# Patient Record
Sex: Female | Born: 1960 | Race: Black or African American | Hispanic: No | Marital: Single | Smoking: Never smoker
Health system: Southern US, Community
[De-identification: ages and names within clinical notes are randomized; demographics above are authoritative.]

## PROBLEM LIST (undated history)

## (undated) DIAGNOSIS — G43909 Migraine, unspecified, not intractable, without status migrainosus: Secondary | ICD-10-CM

## (undated) HISTORY — PX: ABDOMINAL HYSTERECTOMY: SHX81

## (undated) HISTORY — PX: BREAST BIOPSY: SHX20

---

## 2004-09-01 ENCOUNTER — Emergency Department: Payer: Self-pay | Admitting: Emergency Medicine

## 2005-02-01 ENCOUNTER — Ambulatory Visit: Payer: Self-pay | Admitting: Internal Medicine

## 2006-03-13 ENCOUNTER — Inpatient Hospital Stay: Payer: Self-pay | Admitting: Obstetrics and Gynecology

## 2006-05-10 ENCOUNTER — Ambulatory Visit: Payer: Self-pay | Admitting: Obstetrics and Gynecology

## 2006-06-05 ENCOUNTER — Ambulatory Visit: Payer: Self-pay | Admitting: Obstetrics and Gynecology

## 2007-02-21 ENCOUNTER — Ambulatory Visit: Payer: Self-pay

## 2007-03-23 ENCOUNTER — Ambulatory Visit: Payer: Self-pay

## 2007-04-20 ENCOUNTER — Ambulatory Visit: Payer: Self-pay | Admitting: Dermatology

## 2007-06-08 ENCOUNTER — Emergency Department: Payer: Self-pay | Admitting: Emergency Medicine

## 2007-06-15 ENCOUNTER — Ambulatory Visit: Payer: Self-pay | Admitting: Dermatology

## 2007-07-17 ENCOUNTER — Ambulatory Visit: Payer: Self-pay | Admitting: Dermatology

## 2008-07-17 ENCOUNTER — Ambulatory Visit: Payer: Self-pay | Admitting: Internal Medicine

## 2009-04-08 ENCOUNTER — Ambulatory Visit: Payer: Self-pay | Admitting: Internal Medicine

## 2010-05-12 ENCOUNTER — Ambulatory Visit: Payer: Self-pay | Admitting: Internal Medicine

## 2011-09-22 ENCOUNTER — Ambulatory Visit: Payer: Self-pay

## 2012-09-24 ENCOUNTER — Ambulatory Visit: Payer: Self-pay | Admitting: Gastroenterology

## 2012-09-25 ENCOUNTER — Ambulatory Visit: Payer: Self-pay | Admitting: Internal Medicine

## 2012-11-05 ENCOUNTER — Ambulatory Visit: Payer: Self-pay | Admitting: Internal Medicine

## 2014-01-17 ENCOUNTER — Ambulatory Visit: Payer: Self-pay | Admitting: Internal Medicine

## 2015-01-23 ENCOUNTER — Ambulatory Visit
Admission: EM | Admit: 2015-01-23 | Discharge: 2015-01-23 | Disposition: A | Discharge status: Home / Self Care | Payer: No Typology Code available for payment source | Attending: Family Medicine | Admitting: Family Medicine

## 2015-01-23 DIAGNOSIS — J029 Acute pharyngitis, unspecified: Secondary | ICD-10-CM

## 2015-01-23 DIAGNOSIS — J019 Acute sinusitis, unspecified: Secondary | ICD-10-CM

## 2015-01-23 DIAGNOSIS — J40 Bronchitis, not specified as acute or chronic: Secondary | ICD-10-CM | POA: Diagnosis not present

## 2015-01-23 DIAGNOSIS — H6593 Unspecified nonsuppurative otitis media, bilateral: Secondary | ICD-10-CM | POA: Diagnosis not present

## 2015-01-23 HISTORY — DX: Migraine, unspecified, not intractable, without status migrainosus: G43.909

## 2015-01-23 LAB — RAPID STREP SCREEN (MED CTR MEBANE ONLY): STREPTOCOCCUS, GROUP A SCREEN (DIRECT): NEGATIVE

## 2015-01-23 MED ORDER — BENZONATATE 200 MG PO CAPS: 200.0000 mg | ORAL_CAPSULE | Freq: Three times a day (TID) | ORAL | Status: DC | PRN

## 2015-01-23 MED ORDER — SALINE SPRAY 0.65 % NA SOLN: 2.0000 | NASAL | Status: DC

## 2015-01-23 MED ORDER — FLUTICASONE PROPIONATE 50 MCG/ACT NA SUSP: 1.0000 | Freq: Two times a day (BID) | NASAL | Status: DC

## 2015-01-23 MED ORDER — AMOXICILLIN-POT CLAVULANATE 875-125 MG PO TABS: 1.0000 | ORAL_TABLET | Freq: Two times a day (BID) | ORAL | Status: DC

## 2015-01-23 MED ORDER — ACETAMINOPHEN 500 MG PO TABS
1000.0000 mg | ORAL_TABLET | Freq: Four times a day (QID) | ORAL | Status: DC | PRN
Start: 2015-01-23 — End: 2017-03-17

## 2015-01-23 NOTE — ED Provider Notes (Addendum)
CSN: [id number]     Arrival date & time 01/23/15  0848 History   First MD Initiated Contact with Patient 01/23/15 0912     Chief Complaint  Patient presents with  . URI   (Consider location/radiation/quality/duration/timing/severity/associated sxs/prior Treatment) HPI Comments: Married african [place] female works at [employer] in [employer].  Denied exposure to sick contacts.  Started 19 Jan 2015 hoarse voice which is now coming back had lost it, productive green cough, nasal congestion, taking mucinex has helped with congestion.  Frontal headache.  Denied seasonal allergies.  Taking steam showers.  Off work until 24 Oct.  Patient is a 54 y.o. female presenting with URI. The history is provided by the patient.  URI Presenting symptoms: congestion, cough, facial pain and sore throat   Presenting symptoms: no ear pain, no fatigue, no fever and no rhinorrhea   Congestion:    Location:  Nasal   Interferes with sleep: no     Interferes with eating/drinking: no   Cough:    Cough characteristics:  Productive   Sputum characteristics:  Green   Severity:  Mild   Onset quality:  Sudden   Duration:  5 days   Timing:  Intermittent   Progression:  Unchanged   Chronicity:  New Sore throat:    Severity:  Moderate   Onset quality:  Sudden   Duration:  5 days   Timing:  Constant   Progression:  Unchanged Severity:  Moderate Onset quality:  Sudden Duration:  5 days Timing:  Constant Progression:  Unchanged Chronicity:  New Relieved by:  Nothing Ineffective treatments:  Decongestant, OTC medications, rest and drinking Associated symptoms: headaches   Associated symptoms: no arthralgias, no myalgias, no neck pain, no sinus pain, no sneezing, no swollen glands and no wheezing   Headaches:    Severity:  Moderate   Onset quality:  Sudden   Duration:  5 days   Timing:  Intermittent   Progression:  Unchanged   Chronicity:  New Risk factors: not elderly, no chronic cardiac disease, no  chronic kidney disease, no chronic respiratory disease, no diabetes mellitus, no immunosuppression, no recent illness, no recent travel and no sick contacts     Past Medical History  Diagnosis Date  . Migraines    Past Surgical History  Procedure Laterality Date  . Abdominal hysterectomy     Family History  Problem Relation Age of Onset  . Diabetes Mother   . Hypertension Mother   . Heart failure Father    Social History  Substance Use Topics  . Smoking status: Never Smoker   . Smokeless tobacco: None  . Alcohol Use: No   OB History    No data available     Review of Systems  Constitutional: Negative for fever, chills, diaphoresis, activity change, appetite change, fatigue and unexpected weight change.  HENT: Positive for congestion, sore throat and voice change. Negative for dental problem, drooling, ear discharge, ear pain, facial swelling, hearing loss, mouth sores, nosebleeds, postnasal drip, rhinorrhea, sinus pressure, sneezing, tinnitus and trouble swallowing.   Eyes: Negative for photophobia, pain, discharge, redness, itching and visual disturbance.  Respiratory: Positive for cough. Negative for choking, chest tightness, shortness of breath, wheezing and stridor.   Cardiovascular: Negative for chest pain, palpitations and leg swelling.  Gastrointestinal: Negative for nausea, vomiting, abdominal pain, diarrhea, constipation, blood in stool and abdominal distention.  Endocrine: Negative for cold intolerance and heat intolerance.  Genitourinary: Negative for dysuria.  Musculoskeletal: Negative for myalgias, back pain,  joint swelling, arthralgias, gait problem, neck pain and neck stiffness.  Skin: Negative for color change, pallor, rash and wound.  Allergic/Immunologic: Negative for environmental allergies and food allergies.  Neurological: Positive for headaches. Negative for dizziness, tremors, seizures, syncope, facial asymmetry, speech difficulty, weakness,  light-headedness and numbness.  Hematological: Negative for adenopathy. Does not bruise/bleed easily.  Psychiatric/Behavioral: Negative for behavioral problems, confusion, sleep disturbance and agitation.    Allergies  Review of patient's allergies indicates no known allergies.  Home Medications   Prior to Admission medications   Medication Sig Start Date End Date Taking? Authorizing Provider  amitriptyline (ELAVIL) 25 MG tablet Take 25 mg by mouth at bedtime.   Yes Historical Provider, MD  acetaminophen (TYLENOL) 500 MG tablet Take 2 tablets (1,000 mg total) by mouth every 6 (six) hours as needed for moderate pain or fever. 01/23/15   Barbaraann Barthel, NP  amoxicillin-clavulanate (AUGMENTIN) 875-125 MG tablet Take 1 tablet by mouth every 12 (twelve) hours. 01/23/15   Barbaraann Barthel, NP  benzonatate (TESSALON) 200 MG capsule Take 1 capsule (200 mg total) by mouth 3 (three) times daily as needed for cough. 01/23/15   Barbaraann Barthel, NP  fluticasone (FLONASE) 50 MCG/ACT nasal spray Place 1 spray into both nostrils 2 (two) times daily. 01/23/15   Barbaraann Barthel, NP  sodium chloride (OCEAN) 0.65 % SOLN nasal spray Place 2 sprays into both nostrils every 2 (two) hours while awake. 01/23/15   Barbaraann Barthel, NP   Meds Ordered and Administered this Visit  Medications - No data to display  BP 86/59 mmHg  Pulse 82  Temp(Src) 96.7 F (35.9 C) (Tympanic)  Resp 17  Ht 5\' 1"  (1.549 m)  Wt 145 lb (65.772 kg)  BMI 27.41 kg/m2  SpO2 100% No data found.   Physical Exam  Constitutional: She is oriented to person, place, and time. Vital signs are normal. She appears well-developed and well-nourished. She is active.  Non-toxic appearance. She does not have a sickly appearance. She appears ill. No distress.  HENT:  Head: Normocephalic and atraumatic.  Right Ear: Hearing, external ear and ear canal normal. A middle ear effusion is present.  Left Ear: Hearing, external ear and ear canal  normal. A middle ear effusion is present.  Nose: Mucosal edema and rhinorrhea present. No nose lacerations, sinus tenderness, nasal deformity, septal deviation or nasal septal hematoma. No epistaxis.  No foreign bodies. Right sinus exhibits no maxillary sinus tenderness and no frontal sinus tenderness. Left sinus exhibits no maxillary sinus tenderness and no frontal sinus tenderness.  Mouth/Throat: Uvula is midline and oropharynx is clear and moist. Mucous membranes are not pale, not dry and not cyanotic. She does not have dentures. No oral lesions. No trismus in the jaw. Normal dentition. No dental abscesses, uvula swelling, lacerations or dental caries. No oropharyngeal exudate.  Macular oropharyngeal rash; malampati IV airway; bilateral TMs with air fluid level clear; bilateral nasal turbinates with edema/erythema  Eyes: Conjunctivae, EOM and lids are normal. Pupils are equal, round, and reactive to light. Right eye exhibits no chemosis, no discharge, no exudate and no hordeolum. No foreign body present in the right eye. Left eye exhibits no chemosis, no discharge, no exudate and no hordeolum. No foreign body present in the left eye. Right conjunctiva is not injected. Right conjunctiva has no hemorrhage. Left conjunctiva is not injected. Left conjunctiva has no hemorrhage. No scleral icterus. Right eye exhibits normal extraocular motion and no nystagmus. Left eye exhibits normal  extraocular motion and no nystagmus. Right pupil is round and reactive. Left pupil is round and reactive. Pupils are equal.  Neck: Trachea normal and normal range of motion. Neck supple. No tracheal tenderness, no spinous process tenderness and no muscular tenderness present. No rigidity. No tracheal deviation, no edema, no erythema and normal range of motion present. No thyroid mass and no thyromegaly present.  Cardiovascular: Normal rate, regular rhythm, S1 normal, S2 normal, normal heart sounds and intact distal pulses.  PMI is  not displaced.  Exam reveals no gallop and no friction rub.   No murmur heard. Pulmonary/Chest: Effort normal and breath sounds normal. No accessory muscle usage or stridor. No respiratory distress. She has no decreased breath sounds. She has no wheezes. She has no rhonchi. She has no rales. She exhibits no tenderness.  Abdominal: Soft. She exhibits no distension.  Musculoskeletal: Normal range of motion. She exhibits no edema or tenderness.  Lymphadenopathy:       Head (right side): No submental, no submandibular, no tonsillar, no preauricular, no posterior auricular and no occipital adenopathy present.       Head (left side): No submental, no submandibular, no tonsillar, no preauricular, no posterior auricular and no occipital adenopathy present.    She has no cervical adenopathy.       Right cervical: No superficial cervical, no deep cervical and no posterior cervical adenopathy present.      Left cervical: No superficial cervical, no deep cervical and no posterior cervical adenopathy present.  Neurological: She is alert and oriented to person, place, and time. She displays no atrophy and no tremor. No cranial nerve deficit or sensory deficit. She exhibits normal muscle tone. She displays no seizure activity. Coordination and gait normal. GCS eye subscore is 4. GCS verbal subscore is 5. GCS motor subscore is 6.  Skin: Skin is warm, dry and intact. No abrasion, no bruising, no burn, no ecchymosis, no laceration, no lesion, no petechiae and no rash noted. She is not diaphoretic. No cyanosis or erythema. No pallor. Nails show no clubbing.  Psychiatric: She has a normal mood and affect. Her speech is normal and behavior is normal. Judgment and thought content normal. Cognition and memory are normal.  Nursing note and vitals reviewed.   ED Course  Procedures (including critical care time)  Labs Review Labs Reviewed  RAPID STREP SCREEN (NOT AT [facility])    Imaging Review No results  found.     MDM   1. Bronchitis   2. Acute rhinosinusitis   3. Otitis media with effusion, bilateral   4. Acute pharyngitis, unspecified pharyngitis type   Tylenol  po QID, honey with lemon, humidifier, tessalon pearles  po TID prn cough.  Given Rx for augmentin  po BID x 10 days for use if fever greater than 100.5, opaque sputum, dyspnea.  Bronchitis simple, community acquired, may have started as viral (probably respiratory syncytial, parainfluenza, influenza, or adenovirus), but now evidence of acute purulent bronchitis with resultant bronchial edema and mucus formation.  Viruses are the most common cause of bronchial inflammation in otherwise healthy adults with acute bronchitis.  The appearance of sputum is not predictive of whether a bacterial infection is present.  Purulent sputum is most often caused by viral infections.  There are a small portion of those caused by non-viral agents being Mycoplamsa pneumonia.  Microscopic examination or C&S of sputum in the healthy adult with acute bronchitis is generally not helpful (usually negative or normal respiratory flora) other considerations being  cough from upper respiratory tract infections, sinusitis or allergic syndromes (mild asthma or viral pneumonia).  Differential Diagnosis:  reactive airway disease (asthma, allergic aspergillosis (eosinophilia), chronic bronchitis, respiratory infection (Sinusitis, Common cold, pneumonia), congestive heart failure, reflux esophagitis, bronchogenic tumor, aspiration syndromes and/or exposure irritants/tobacco smoke.  In this case, there is no evidence of any invasive bacterial illness.  Most likely viral etiology so will hold on antibiotic treatment.  Advise supportive care with rest, encourage fluids, good hygiene and watch for any worsening symptoms.  If they were to develop:  come back to the office or go to the emergency room if after hours. Without high fever, severe dyspnea, lack of physical  findings or other risk factors, I will hold on a chest radiograph and CBC at this time. I discussed that approximately 50% of patients with acute bronchitis have a cough that lasts up to three weeks, and 25% for over a month.  Tylenol, one to two tablets every four hours as needed for fever or myalgias.   No aspirin.  Patient instructed to follow up in one week or sooner if symptoms worsen. Patient verbalized agreement and understanding of treatment plan.  P2:  hand washing and cover cough  Start flonase 1 spray each nostril bid.  Nasal saline 2 sprays each nostril q2h prn congestion.  If no improvement worsening sinus pressure/headache, fever greater than 100.73F start augmentin  po BID Sunday 25 Jan 2015 Rx given  Tylenol  po QID prn pain/fever. No evidence of systemic bacterial infection, non toxic and well hydrated.  I do not see where any further testing or imaging is necessary at this time.   I will suggest supportive care, rest, good hygiene and encourage the patient to take adequate fluids.  The patient is to return to clinic or EMERGENCY ROOM if symptoms worsen or change significantly.  Exitcare handout on sinusitis given to patient.  Patient verbalized agreement and understanding of treatment plan and had no further questions at this time.   P2:  Hand washing and cover cough   Supportive treatment.   No evidence of invasive bacterial infection, non toxic and well hydrated.  This is most likely self limiting viral infection.  I do not see where any further testing or imaging is necessary at this time.   I will suggest supportive care, rest, good hygiene and encourage the patient to take adequate fluids.  The patient is to return to clinic or EMERGENCY ROOM if symptoms worsen or change significantly e.g. ear pain, fever, purulent discharge from ears or bleeding.  Exitcare handout on otitis media with effusion given to patient.  Patient verbalized agreement and understanding of treatment plan.     Discussed rapid strep negative, throat culture results typically available 48 hours will call with results.  Hydrate, hydrate, hydrate.  Discussed bland diet avoid spicy, fried, large portions meat, dairy until symptoms resolved.  Will call with throat culture results once available.  Has Rx for augmentin  po BID x 10 days to use in case positive.  Suspect due to post nasal drip.  Tylenol  po QID prn pain.  Honey with lemon, salt water gargles.  Usually no specific medical treatment is needed if a virus is causing the sore throat.  The throat most often gets better on its own within 5 to 7 days.  Antibiotic medicine does not cure viral pharyngitis.   For acute pharyngitis caused by bacteria, your healthcare provider will prescribe an antibiotic.  Marland Kitchen Do not smoke.  Marland Kitchen  Avoid secondhand smoke and other air pollutants.  . Use a cool mist humidifier to add moisture to the air.  . Get plenty of rest.  . You may want to rest your throat by talking less and eating a diet that is mostly liquid or soft for a day or two.   Marland Kitchen Nonprescription throat lozenges and mouthwashes should help relieve the soreness.   . Gargling with warm saltwater and drinking warm liquids may help.  (You can make a saltwater solution by adding 1/4 teaspoon of salt to 8 ounces, or 240 mL, of warm water.)  . A nonprescription pain reliever such as acetaminophen may ease general aches and pains.   FOLLOW UP with clinic provider if no improvements in the next 7-10 days.  Patient verbalized understanding of instructions and agreed with plan of care. P2:  Hand washing and diet.    Barbaraann Barthel, NP 01/23/15 1003  26 Jan 2015 at 1912 Telephone message left for patient throat culture normal negative.    Barbaraann Barthel, NP 01/26/15 1912

## 2015-01-23 NOTE — ED Notes (Signed)
Started Monday with sore throat and now productive cough.Denies fever

## 2015-01-23 NOTE — Discharge Instructions (Signed)
Pharyngitis Pharyngitis is redness, pain, and swelling (inflammation) of your pharynx.  CAUSES  Pharyngitis is usually caused by infection. Most of the time, these infections are from viruses (viral) and are part of a cold. However, sometimes pharyngitis is caused by bacteria (bacterial). Pharyngitis can also be caused by allergies. Viral pharyngitis may be spread from person to person by coughing, sneezing, and personal items or utensils (cups, forks, spoons, toothbrushes). Bacterial pharyngitis may be spread from person to person by more intimate contact, such as kissing.  SIGNS AND SYMPTOMS  Symptoms of pharyngitis include:   Sore throat.   Tiredness (fatigue).   Low-grade fever.   Headache.  Joint pain and muscle aches.  Skin rashes.  Swollen lymph nodes.  Plaque-like film on throat or tonsils (often seen with bacterial pharyngitis). DIAGNOSIS  Your health care provider will ask you questions about your illness and your symptoms. Your medical history, along with a physical exam, is often all that is needed to diagnose pharyngitis. Sometimes, a rapid strep test is done. Other lab tests may also be done, depending on the suspected cause.  TREATMENT  Viral pharyngitis will usually get better in 3-4 days without the use of medicine. Bacterial pharyngitis is treated with medicines that kill germs (antibiotics).  HOME CARE INSTRUCTIONS   Drink enough water and fluids to keep your urine clear or pale yellow.   Only take over-the-counter or prescription medicines as directed by your health care provider:   If you are prescribed antibiotics, make sure you finish them even if you start to feel better.   Do not take aspirin.   Get lots of rest.   Gargle with 8 oz of salt water ( tsp of salt per 1 qt of water) as often as every 1-2 hours to soothe your throat.   Throat lozenges (if you are not at risk for choking) or sprays may be used to soothe your throat. SEEK MEDICAL  CARE IF:   You have large, tender lumps in your neck.  You have a rash.  You cough up green, yellow-brown, or bloody spit. SEEK IMMEDIATE MEDICAL CARE IF:   Your neck becomes stiff.  You drool or are unable to swallow liquids.  You vomit or are unable to keep medicines or liquids down.  You have severe pain that does not go away with the use of recommended medicines.  You have trouble breathing (not caused by a stuffy nose). MAKE SURE YOU:   Understand these instructions.  Will watch your condition.  Will get help right away if you are not doing well or get worse.   This information is not intended to replace advice given to you by your health care provider. Make sure you discuss any questions you have with your health care provider.   Document Released: 03/21/2005 Document Revised: 01/09/2013 Document Reviewed: 11/26/2012 Elsevier Interactive Patient Education 2016 [place]. Otitis Media With Effusion Otitis media with effusion is the presence of fluid in the middle ear. This is a common problem in children, which often follows ear infections. It may be present for weeks or longer after the infection. Unlike an acute ear infection, otitis media with effusion refers only to fluid behind the ear drum and not infection. Children with repeated ear and sinus infections and allergy problems are the most likely to get otitis media with effusion. CAUSES  The most frequent cause of the fluid buildup is dysfunction of the eustachian tubes. These are the tubes that drain fluid in the  ears to the back of the nose (nasopharynx). °SYMPTOMS  °· The main symptom of this condition is hearing loss. As a result, you or your child may: °· Listen to the TV at a loud volume. °· Not respond to questions. °· Ask "what" often when spoken to. °· Mistake or confuse one sound or word for another. °· There may be a sensation of fullness or pressure but usually not pain. °DIAGNOSIS  °· Your health care  provider will diagnose this condition by examining you or your child's ears. °· Your health care provider may test the pressure in you or your child's ear with a tympanometer. °· A hearing test may be conducted if the problem persists. °TREATMENT  °· Treatment depends on the duration and the effects of the effusion. °· Antibiotics, decongestants, nose drops, and cortisone-type drugs (tablets or nasal spray) may not be helpful. °· Children with persistent ear effusions may have delayed language or behavioral problems. Children at risk for developmental delays in hearing, learning, and speech may require referral to a specialist earlier than children not at risk. °· You or your child's health care provider may suggest a referral to an ear, nose, and throat surgeon for treatment. The following may help restore normal hearing: °· Drainage of fluid. °· Placement of ear tubes (tympanostomy tubes). °· Removal of adenoids (adenoidectomy). °HOME CARE INSTRUCTIONS  °· Avoid secondhand smoke. °· Infants who are breastfed are less likely to have this condition. °· Avoid feeding infants while they are lying flat. °· Avoid known environmental allergens. °· Avoid people who are sick. °SEEK MEDICAL CARE IF:  °· Hearing is not better in 3 months. °· Hearing is worse. °· Ear pain. °· Drainage from the ear. °· Dizziness. °MAKE SURE YOU:  °· Understand these instructions. °· Will watch your condition. °· Will get help right away if you are not doing well or get worse. °  °This information is not intended to replace advice given to you by your health care provider. Make sure you discuss any questions you have with your health care provider. °  °Document Released: 04/28/2004 Document Revised: 04/11/2014 Document Reviewed: 10/16/2012 °Elsevier Interactive Patient Education ©2016 [employer] °Sinusitis, Adult °Sinusitis is redness, soreness, and inflammation of the paranasal sinuses. Paranasal sinuses are air pockets within the bones of  your face. They are located beneath your eyes, in the middle of your forehead, and above your eyes. In healthy paranasal sinuses, mucus is able to drain out, and air is able to circulate through them by way of your nose. However, when your paranasal sinuses are inflamed, mucus and air can become trapped. This can allow bacteria and other germs to grow and cause infection. °Sinusitis can develop quickly and last only a short time (acute) or continue over a long period (chronic). Sinusitis that lasts for more than 12 weeks is considered chronic. °CAUSES °Causes of sinusitis include: °· Allergies. °· Structural abnormalities, such as displacement of the cartilage that separates your nostrils (deviated septum), which can decrease the air flow through your nose and sinuses and affect sinus drainage. °· Functional abnormalities, such as when the small hairs (cilia) that line your sinuses and help remove mucus do not work properly or are not present. °SIGNS AND SYMPTOMS °Symptoms of acute and chronic sinusitis are the same. The primary symptoms are pain and pressure around the affected sinuses. Other symptoms include: °· Upper toothache. °· Earache. °· Headache. °· Bad breath. °· Decreased sense of smell and taste. °· A   cough, which worsens when you are lying flat.  Fatigue.  Fever.  Thick drainage from your nose, which often is green and may contain pus (purulent).  Swelling and warmth over the affected sinuses. DIAGNOSIS Your health care provider will perform a physical exam. During your exam, your health care provider may perform any of the following to help determine if you have acute sinusitis or chronic sinusitis:  Look in your nose for signs of abnormal growths in your nostrils (nasal polyps).  Tap over the affected sinus to check for signs of infection.  View the inside of your sinuses using an imaging device that has a light attached (endoscope). If your health care provider suspects that you have  chronic sinusitis, one or more of the following tests may be recommended:  Allergy tests.  Nasal culture. A sample of mucus is taken from your nose, sent to a lab, and screened for bacteria.  Nasal cytology. A sample of mucus is taken from your nose and examined by your health care provider to determine if your sinusitis is related to an allergy. TREATMENT Most cases of acute sinusitis are related to a viral infection and will resolve on their own within 10 days. Sometimes, medicines are prescribed to help relieve symptoms of both acute and chronic sinusitis. These may include pain medicines, decongestants, nasal steroid sprays, or saline sprays. However, for sinusitis related to a bacterial infection, your health care provider will prescribe antibiotic medicines. These are medicines that will help kill the bacteria causing the infection. Rarely, sinusitis is caused by a fungal infection. In these cases, your health care provider will prescribe antifungal medicine. For some cases of chronic sinusitis, surgery is needed. Generally, these are cases in which sinusitis recurs more than 3 times per year, despite other treatments. HOME CARE INSTRUCTIONS  Drink plenty of water. Water helps thin the mucus so your sinuses can drain more easily.  Use a humidifier.  Inhale steam 3-4 times a day (for example, sit in the bathroom with the shower running).  Apply a warm, moist washcloth to your face 3-4 times a day, or as directed by your health care provider.  Use saline nasal sprays to help moisten and clean your sinuses.  Take medicines only as directed by your health care provider.  If you were prescribed either an antibiotic or antifungal medicine, finish it all even if you start to feel better. SEEK IMMEDIATE MEDICAL CARE IF:  You have increasing pain or severe headaches.  You have nausea, vomiting, or drowsiness.  You have swelling around your face.  You have vision problems.  You have  a stiff neck.  You have difficulty breathing.   This information is not intended to replace advice given to you by your health care provider. Make sure you discuss any questions you have with your health care provider.   Document Released: 03/21/2005 Document Revised: 04/11/2014 Document Reviewed: 04/05/2011 Elsevier Interactive Patient Education 2016 Else[employer] Inc[employer] Acute Bronchitis Bronchitis is inflammation of the airways that extend from the windpipe into the lungs (bronchi). The inflammation often causes mucus to develop. This leads to a cough, which is the most common symptom of bronchitis.  In acute bronchitis, the condition usually develops suddenly and goes away over time, usually in a couple weeks. Smoking, allergies, and asthma can make bronchitis worse. Repeated episodes of bronchitis may cause further lung problems.  CAUSES Acute bronchitis is most often caused by the same virus that causes a cold. The virus can spread from  person to person (contagious) through coughing, sneezing, and touching contaminated objects. SIGNS AND SYMPTOMS   Cough.   Fever.   Coughing up mucus.   Body aches.   Chest congestion.   Chills.   Shortness of breath.   Sore throat.  DIAGNOSIS  Acute bronchitis is usually diagnosed through a physical exam. Your health care provider will also ask you questions about your medical history. Tests, such as chest X-rays, are sometimes done to rule out other conditions.  TREATMENT  Acute bronchitis usually goes away in a couple weeks. Oftentimes, no medical treatment is necessary. Medicines are sometimes given for relief of fever or cough. Antibiotic medicines are usually not needed but may be prescribed in certain situations. In some cases, an inhaler may be recommended to help reduce shortness of breath and control the cough. A cool mist vaporizer may also be used to help thin bronchial secretions and make it easier to clear the chest.  HOME CARE  INSTRUCTIONS  Get plenty of rest.   Drink enough fluids to keep your urine clear or pale yellow (unless you have a medical condition that requires fluid restriction). Increasing fluids may help thin your respiratory secretions (sputum) and reduce chest congestion, and it will prevent dehydration.   Take medicines only as directed by your health care provider.  If you were prescribed an antibiotic medicine, finish it all even if you start to feel better.  Avoid smoking and secondhand smoke. Exposure to cigarette smoke or irritating chemicals will make bronchitis worse. If you are a smoker, consider using nicotine gum or skin patches to help control withdrawal symptoms. Quitting smoking will help your lungs heal faster.   Reduce the chances of another bout of acute bronchitis by washing your hands frequently, avoiding people with cold symptoms, and trying not to touch your hands to your mouth, nose, or eyes.   Keep all follow-up visits as directed by your health care provider.  SEEK MEDICAL CARE IF: Your symptoms do not improve after 1 week of treatment.  SEEK IMMEDIATE MEDICAL CARE IF:  You develop an increased fever or chills.   You have chest pain.   You have severe shortness of breath.  You have bloody sputum.   You develop dehydration.  You faint or repeatedly feel like you are going to pass out.  You develop repeated vomiting.  You develop a severe headache. MAKE SURE YOU:   Understand these instructions.  Will watch your condition.  Will get help right away if you are not doing well or get worse.   This information is not intended to replace advice given to you by your health care provider. Make sure you discuss any questions you have with your health care provider.   Document Released: 04/28/2004 Document Revised: 04/11/2014 Document Reviewed: 09/11/2012 Elsevier Interactive Patient Education 2016 Elsevier Inc. Viral Infections A virus is a type of germ.  Viruses can cause:  Minor sore throats.  Aches and pains.  Headaches.  Runny nose.  Rashes.  Watery eyes.  Tiredness.  Coughs.  Loss of appetite.  Feeling sick to your stomach (nausea).  Throwing up (vomiting).  Watery poop (diarrhea). HOME CARE   Only take medicines as told by your doctor.  Drink enough water and fluids to keep your pee (urine) clear or pale yellow. Sports drinks are a good choice.  Get plenty of rest and eat healthy. Soups and broths with crackers or rice are fine. GET HELP RIGHT AWAY IF:   You have  a very bad headache.  You have shortness of breath.  You have chest pain or neck pain.  You have an unusual rash.  You cannot stop throwing up.  You have watery poop that does not stop.  You cannot keep fluids down.  You or your child has a temperature by mouth above 102 F (38.9 C), not controlled by medicine.  Your baby is older than 3 months with a rectal temperature of 102 F (38.9 C) or higher.  Your baby is 87 months old or younger with a rectal temperature of 100.4 F (38 C) or higher. MAKE SURE YOU:   Understand these instructions.  Will watch this condition.  Will get help right away if you are not doing well or get worse.   This information is not intended to replace advice given to you by your health care provider. Make sure you discuss any questions you have with your health care provider.   Document Released: 03/03/2008 Document Revised: 06/13/2011 Document Reviewed: 08/27/2014 Elsevier Interactive Patient Education [employer]

## 2015-01-25 LAB — CULTURE, GROUP A STREP (THRC)

## 2016-01-11 ENCOUNTER — Other Ambulatory Visit: Payer: Self-pay | Admitting: Internal Medicine

## 2016-01-11 DIAGNOSIS — Z1231 Encounter for screening mammogram for malignant neoplasm of breast: Secondary | ICD-10-CM

## 2016-02-08 ENCOUNTER — Ambulatory Visit: Payer: No Typology Code available for payment source

## 2016-03-01 ENCOUNTER — Ambulatory Visit
Admission: RE | Admit: 2016-03-01 | Discharge: 2016-03-01 | Disposition: A | Discharge status: Home / Self Care | Payer: 59 | Source: Ambulatory Visit | Attending: Internal Medicine | Admitting: Internal Medicine

## 2016-03-01 DIAGNOSIS — Z1231 Encounter for screening mammogram for malignant neoplasm of breast: Secondary | ICD-10-CM | POA: Insufficient documentation

## 2017-03-17 ENCOUNTER — Encounter: Payer: Self-pay | Admitting: Gynecology

## 2017-03-17 ENCOUNTER — Ambulatory Visit
Admission: EM | Admit: 2017-03-17 | Discharge: 2017-03-17 | Disposition: A | Discharge status: Home / Self Care | Payer: 59 | Attending: Family Medicine | Admitting: Family Medicine

## 2017-03-17 ENCOUNTER — Other Ambulatory Visit: Payer: Self-pay

## 2017-03-17 DIAGNOSIS — J069 Acute upper respiratory infection, unspecified: Secondary | ICD-10-CM | POA: Diagnosis not present

## 2017-03-17 DIAGNOSIS — J029 Acute pharyngitis, unspecified: Secondary | ICD-10-CM | POA: Diagnosis not present

## 2017-03-17 DIAGNOSIS — R058 Other specified cough: Secondary | ICD-10-CM

## 2017-03-17 DIAGNOSIS — R05 Cough: Secondary | ICD-10-CM

## 2017-03-17 MED ORDER — ALBUTEROL SULFATE HFA 108 (90 BASE) MCG/ACT IN AERS: 2.0000 | INHALATION_SPRAY | RESPIRATORY_TRACT | 0 refills | Status: AC | PRN

## 2017-03-17 MED ORDER — PREDNISONE 20 MG PO TABS: 40.0000 mg | ORAL_TABLET | Freq: Every day | ORAL | 0 refills | Status: DC

## 2017-03-17 MED ORDER — HYDROCOD POLST-CPM POLST ER 10-8 MG/5ML PO SUER: 5.0000 mL | Freq: Every evening | ORAL | 0 refills | Status: DC | PRN

## 2017-03-17 MED ORDER — IPRATROPIUM-ALBUTEROL 0.5-2.5 (3) MG/3ML IN SOLN
3.0000 mL | Freq: Once | RESPIRATORY_TRACT | Status: AC
  Administered 2017-03-17: 3 mL via RESPIRATORY_TRACT

## 2017-03-17 MED ORDER — AZITHROMYCIN 250 MG PO TABS: ORAL_TABLET | ORAL | 0 refills | Status: DC

## 2017-03-17 MED ORDER — BENZONATATE 100 MG PO CAPS: 100.0000 mg | ORAL_CAPSULE | Freq: Three times a day (TID) | ORAL | 0 refills | Status: DC | PRN

## 2017-03-17 NOTE — Discharge Instructions (Signed)
Take medication as prescribed. Rest. Drink plenty of fluids.  ° °Follow up with your primary care physician this week as needed. Return to Urgent care for new or worsening concerns.  ° °

## 2017-03-17 NOTE — ED Provider Notes (Signed)
[facility] ____________________________________________  Time seen: Approximately 1:30 PM  I have reviewed the triage vital signs and the nursing notes.   HISTORY  Chief Complaint Cough and Sore Throat  HPI  Jessica Paul is a 56 y.o. female presenting for evaluation of cough and congestion is been present for overall 3 weeks, but reports have worsened over the last 3 days. Reports cough and congestion were not bad over the 3 weeks time, and were tolerable except for worse in the last 3 days.  States coughing is coming in coughing fits. States cough is daytime and nighttime.  Reports the nasal congestion that was initially present has since resolved last week.  States has some throat irritation when coughing only, denies sore throat with swallowing.  Reports continues to eat and drink well.  States drinking lots of fluids.  States unresolved with over-the-counter Mucinex as well as cough drops.  Reports granddaughter recently sick with similar.  States cough is productive intermittently of yellowish phlegm and has been more productive for the last few days. Denies hemoptysis.  Denies known fevers.  Denies other aggravating or alleviating factors. Denies chest pain, shortness of breath, abdominal pain, dysuria, extremity pain, extremity swelling or rash. Denies recent sickness. Denies recent antibiotic use. Denies diabetes, cardiac history or renal insufficiency.  Jessica Paul, John B III, MD: PCP    Past Medical History:  Diagnosis Date  . Migraines     There are no active problems to display for this patient.   Past Surgical History:  Procedure Laterality Date  . ABDOMINAL HYSTERECTOMY       No current facility-administered medications for this encounter.   Current Outpatient Medications:  .  amitriptyline (ELAVIL) 25 MG tablet, Take 25 mg by mouth at bedtime., Disp: , Rfl:  .  albuterol (PROVENTIL HFA;VENTOLIN HFA) 108 (90 Base) MCG/ACT inhaler, Inhale 2 puffs  into the lungs every 4 (four) hours as needed for wheezing., Disp: 1 Inhaler, Rfl: 0 .  azithromycin (ZITHROMAX Z-PAK) 250 MG tablet, Take 2 tablets (500 mg) on  Day 1,  followed by 1 tablet (250 mg) once daily on Days 2 through 5., Disp: 6 each, Rfl: 0 .  benzonatate (TESSALON PERLES) 100 MG capsule, Take 1 capsule (100 mg total) by mouth 3 (three) times daily as needed for cough., Disp: 15 capsule, Rfl: 0 .  chlorpheniramine-HYDROcodone (TUSSIONEX PENNKINETIC ER) 10-8 MG/5ML SUER, Take 5 mLs by mouth at bedtime as needed for cough. do not drive or operate machinery while taking as can cause drowsiness., Disp: 75 mL, Rfl: 0 .  predniSONE (DELTASONE) 20 MG tablet, Take 2 tablets (40 mg total) by mouth daily., Disp: 10 tablet, Rfl: 0  Allergies Patient has no known allergies.  Family History  Problem Relation Age of Onset  . Diabetes Mother   . Hypertension Mother   . Heart failure Father   . Breast cancer Neg Hx     Social History Social History   Tobacco Use  . Smoking status: Never Smoker  . Smokeless tobacco: Never Used  Substance Use Topics  . Alcohol use: No  . Drug use: No    Review of Systems Constitutional: No fever/chills ENT: As above.  States does not feel like strep sore throat. Cardiovascular: Denies chest pain. Respiratory: Denies shortness of breath. Gastrointestinal: No abdominal pain. Musculoskeletal: Negative for back pain. Skin: Negative for rash.  ____________________________________________   PHYSICAL EXAM:  VITAL SIGNS: ED Triage Vitals  Enc Vitals Group  BP 03/17/17 1246 (!) 148/102     Pulse Rate 03/17/17 1246 89     Resp 03/17/17 1246 16     Temp 03/17/17 1246 98.2 F (36.8 C)     Temp Source 03/17/17 1246 Oral     SpO2 03/17/17 1246 99 %     Weight 03/17/17 1248 146 lb (66.2 kg)     Height 03/17/17 1248 5\' 2"  (1.575 m)     Head Circumference --      Peak Flow --      Pain Score 03/17/17 1248 8     Pain Loc --      Pain Edu? --       Excl. in GC? --    Vitals:   03/17/17 1246 03/17/17 1248  BP: (!) 148/102   Pulse: 89   Resp: 16   Temp: 98.2 F (36.8 C)   TempSrc: Oral   SpO2: 99%   Weight:  146 lb (66.2 kg)  Height:  5\' 2"  (1.575 m)     Constitutional: Alert and oriented. Well appearing and in no acute distress. Eyes: Conjunctivae are normal.  Head: Atraumatic. No sinus tenderness to palpation. No swelling. No erythema.  Ears: no erythema, normal TMs bilaterally.   Nose:Nasal congestion  Mouth/Throat: Mucous membranes are moist. No pharyngeal erythema. No tonsillar swelling or exudate.  Neck: No stridor.  No cervical spine tenderness to palpation. Hematological/Lymphatic/Immunilogical: No cervical lymphadenopathy. Cardiovascular: Normal rate, regular rhythm. Grossly normal heart sounds.  Good peripheral circulation. Respiratory: Normal respiratory effort.  No retractions. No wheezes, rales or rhonchi. Good air movement.  Dry cough noted in room with bronchospasm.  Speaking in complete sentences. Musculoskeletal: Ambulatory with steady gait.  No tenderness or edema noted to bilateral lower extremities. Neurologic:  Normal speech and language. No gait instability. Skin:  Skin appears warm, dry and intact. No rash noted. Psychiatric: Mood and affect are normal. Speech and behavior are normal. ___________________________________________   LABS (all labs ordered are listed, but only abnormal results are displayed)  Labs Reviewed - No data to display ____________________________________________   PROCEDURES Procedures   INITIAL IMPRESSION / ASSESSMENT AND PLAN / ED COURSE  Pertinent labs & imaging results that were available during my care of the patient were reviewed by me and considered in my medical decision making (see chart for details).  Well-appearing patient.  No acute distress.  A cough for the last few weeks that has worsened over the last 3 days.  Bronchospasm with cough noted.  Lungs  otherwise clear throughout. No history of asthma/COPD. DuoNeb given in urgent care with some decreased cough.  Denies fevers.  Discussed with patient evaluation of chest x-ray, lungs clear otherwise, will defer chest x-ray, patient agrees.  Will treat patient home with oral azithromycin, prednisone for 5 days, as needed albuterol inhaler as needed Tessalon Perles and as needed Tussionex.  Encourage rest, fluids, supportive care and discussed strict follow-up and return parameters.Discussed indication, risks and benefits of medications with patient.  Discussed follow up with Primary care physician this week. Discussed follow up and return parameters including no resolution or any worsening concerns. Patient verbalized understanding and agreed to plan.   ____________________________________________   FINAL CLINICAL IMPRESSION(S) / ED DIAGNOSES  Final diagnoses:  Cough present for greater than 3 weeks  Upper respiratory tract infection, unspecified type     ED Discharge Orders        Ordered    azithromycin (ZITHROMAX Z-PAK) 250 MG tablet  03/17/17 1349    predniSONE (DELTASONE) 20 MG tablet  Daily     03/17/17 1349    albuterol (PROVENTIL HFA;VENTOLIN HFA) 108 (90 Base) MCG/ACT inhaler  Every 4 hours PRN     03/17/17 1349    benzonatate (TESSALON PERLES) 100 MG capsule  3 times daily PRN     03/17/17 1349    chlorpheniramine-HYDROcodone (TUSSIONEX PENNKINETIC ER) 10-8 MG/5ML SUER  At bedtime PRN     03/17/17 1349       Note: This dictation was prepared with Dragon dictation along with smaller phrase technology. Any transcriptional errors that result from this process are unintentional.         Renford DillsMiller, Shannen Vernon, NP 03/17/17 1430

## 2017-03-17 NOTE — ED Triage Notes (Signed)
Patient c/o x 3 weeks cough and sore throat.

## 2017-04-26 ENCOUNTER — Ambulatory Visit: Payer: Managed Care, Other (non HMO) | Attending: Otolaryngology | Admitting: Speech Pathology

## 2017-04-26 DIAGNOSIS — R490 Dysphonia: Secondary | ICD-10-CM | POA: Insufficient documentation

## 2017-04-27 ENCOUNTER — Encounter: Payer: Self-pay | Admitting: Speech Pathology

## 2017-04-27 ENCOUNTER — Other Ambulatory Visit: Payer: Self-pay

## 2017-04-27 NOTE — Therapy (Signed)
[facility] MAIN [facility] SERVICES [address] [place], [place], [address] Phone: [phone]   Fax:  [phone]  Speech Language Pathology Evaluation  Patient Details  Name: Jessica Paul MRN: [id number] Date of Birth: June 18, 1960 Referring Provider: Dr. Elenore Rota   Encounter Date: 04/26/2017  End of Session - 04/27/17 1639    Visit Number  1    Number of Visits  9    Date for SLP Re-Evaluation  05/27/17    SLP Start Time  1400    SLP Stop Time   1500    SLP Time Calculation (min)  60 min    Activity Tolerance  Patient tolerated treatment well       Past Medical History:  Diagnosis Date   Migraines     Past Surgical History:  Procedure Laterality Date   ABDOMINAL HYSTERECTOMY      There were no vitals filed for this visit.      SLP Evaluation [facility] - 04/27/17 0001      SLP Visit Information   SLP Received On  04/26/17    Referring Provider  Dr. Elenore Rota    Onset Date  04/25/2017    Medical Diagnosis  Spasmodic dysphonia      Subjective   Subjective  "I'm holding my cough in"    Patient/Family Stated Goal  To be able to talk normally      General Information   HPI  57 year old woman, with hoarseness and coughing, referred by Dr. Elenore Rota for voice therapy.  Per report, abnormal findings include: very stressed voice and constantly closing her glottis.       Prior Functional Status   Cognitive/Linguistic Baseline  Within functional limits      Oral Motor/Sensory Function   Overall Oral Motor/Sensory Function  Appears within functional limits for tasks assessed      Motor Speech   Overall Motor Speech  Impaired    Respiration  Impaired    Level of Impairment  Conversation    Phonation  Hoarse;Other (comment) Abrupt glottal closue/stops    Resonance  Within functional limits    Articulation  Within functional limitis    Intelligibility  Intelligible    Phonation  Impaired    Vocal Abuses  Habitual Cough/Throat  Clear;Glottal Attack;Habitual Hyperphonia    Tension Present  Jaw;Neck;Shoulder    Volume  Soft    Pitch  Low      Standardized Assessments   Standardized Assessments   Other Assessment Perceptual Voice Evaluation         Perceptual Voice Evaluation Voice checklist:  Health risks: GERD,   Characteristic voice use: normal   Environmental risks: 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  Misuse: speak with tension  Abuse: excessive coughing/throat clearing  Vocal characteristics: strained, hoarse, abrupt glottal closure/stops  Maximum phonation time for sustained ah: 13 seconds Average fundamental frequency during sustained ah: 206 Hz (1.4 STD below average for age and gender Highest dynamic pitch in conversational speech: 207 Hz Lowest dynamic pitch in conversational speech: 156 Hz Average time patient was able to sustain /s/: 5 seconds Average time patient was able to sustain /z/: 8 seconds s/z ratio : 1.6 Visi-Pitch: Multi-Dimensional Voice Program (MDVP)  MDVP extracts objective quantitative values (Relative Average Perturbation, Shimmer, Voice Turbulence Index, and Noise to Harmonic Ratio) on sustained phonation, which are displayed graphically and numerically in comparison to a built-in normative database.  The patient exhibited values outside the  norm for Relative Average Perturbation.  Average fundamental frequency 1.4 STD below average for age and gender. Perceptually, her voice was strained.  Education: Patient instructed in breathing strategies (paused breathing, belly breathing, and straw breathing).  Patient counseled to reduce throat clearing with distracting behavior (eg, swallow instead)  SLP Education - 04/27/17 1638    Education provided  Yes    Education Details  Patient instructed in breathing strategies (paused breathing, belly breathing, and straw breathing).  Patient counseled to reduce throat clearing with distracting behavior (eg, swallow  instead)    Person(s) Educated  Patient    Methods  Explanation;Demonstration;Handout    Comprehension  Verbalized understanding         SLP Long Term Goals - 04/27/17 1642      SLP LONG TERM GOAL #1   Title  laryngeal muscle stretches.      Time  4    Period  Weeks    Status  New    Target Date  05/27/17      SLP LONG TERM GOAL #2   Title  The patient will be independent for abdominal breathing and breath support exercises.    Time  4    Period  Weeks    Status  New    Target Date  05/27/17      SLP LONG TERM GOAL #3   Title  The patient will demonstrate independent understanding of rescue breathing and throat clearing distraction techniques.    Time  4    Period  Weeks    Status  New    Target Date  05/27/17      SLP LONG TERM GOAL #4   Title  The patient will minimize vocal tension via resonant voice therapy (or comparable technique) with min SLP cues with 80% accuracy.    Time  4    Period  Weeks    Status  New    Target Date  05/27/17       Plan - 04/27/17 1640    Clinical Impression Statement  This [age] year woman under the care of Dr. Elenore RotaJuengel, with constant closing of glottis, is presenting with moderate-severe dysphonia characterized by strained/ hoarse vocal quality, abrupt glottal closure/stops, and intrinsic/extrinsic laryngeal tension.  The patient will benefit from voice therapy for education (vocal hygiene, reflux precautions), to decrease glottal closure/stops, improve breath control for speech, reduce laryngeal tension, and promote relaxed phonation / oral resonance. The patient has been given written and verbal teaching in breathing strategies (paused breathing, belly breathing, and straw breathing) and counseled to reduce throat clearing with distracting behavior (eg, swallow instead).    Speech Therapy Frequency  2x / week    Duration  4 weeks    Treatment/Interventions  SLP instruction and feedback;Patient/family education;Other (comment) Voice therapy     Potential to Achieve Goals  Good    Potential Considerations  Ability to learn/carryover information;Severity of impairments;Co-morbidities;Cooperation/participation level;Medical prognosis;Pain level;Previous level of function    SLP Home Exercise Plan  breathing strategies (paused breathing, belly breathing, and straw breathing);   reduce throat clearing with distracting behavior (eg, swallow instead)    Consulted and Agree with Plan of Care  Patient       Patient will benefit from skilled therapeutic intervention in order to improve the following deficits and impairments:   Dysphonia - Plan: SLP plan of care cert/re-cert    Problem List There are no active problems to display for this patient.  Dollene PrimroseSusan G Danay Mckellar, MS/CCC-  SLP  Leandrew Koyanagi 04/27/2017, 4:48 PM  [facility] MAIN [facility] SERVICES [address] [place], [place], [address] Phone: [phone]   Fax:  [phone]  Name: Sadeel Fiddler MRN: [id number] Date of Birth: May 18, 1960

## 2017-05-02 ENCOUNTER — Ambulatory Visit: Payer: Managed Care, Other (non HMO)

## 2017-05-04 ENCOUNTER — Ambulatory Visit: Payer: Self-pay

## 2017-05-08 ENCOUNTER — Ambulatory Visit: Payer: Self-pay | Admitting: Speech Pathology

## 2017-05-12 ENCOUNTER — Ambulatory Visit: Payer: Self-pay | Admitting: Speech Pathology

## 2017-05-16 ENCOUNTER — Ambulatory Visit: Payer: Self-pay

## 2017-05-24 ENCOUNTER — Ambulatory Visit: Payer: Self-pay | Admitting: Speech Pathology

## 2017-05-30 ENCOUNTER — Ambulatory Visit: Payer: Self-pay

## 2017-06-01 ENCOUNTER — Ambulatory Visit: Payer: Self-pay

## 2018-04-18 ENCOUNTER — Ambulatory Visit
Admission: EM | Admit: 2018-04-18 | Discharge: 2018-04-18 | Disposition: A | Discharge status: Home / Self Care | Payer: Managed Care, Other (non HMO) | Attending: Family Medicine | Admitting: Family Medicine

## 2018-04-18 ENCOUNTER — Other Ambulatory Visit: Payer: Self-pay

## 2018-04-18 ENCOUNTER — Encounter: Payer: Self-pay | Admitting: Emergency Medicine

## 2018-04-18 DIAGNOSIS — J069 Acute upper respiratory infection, unspecified: Secondary | ICD-10-CM | POA: Diagnosis not present

## 2018-04-18 DIAGNOSIS — J029 Acute pharyngitis, unspecified: Secondary | ICD-10-CM | POA: Insufficient documentation

## 2018-04-18 NOTE — ED Provider Notes (Signed)
[facility] ____________________________________________  Time seen: Approximately 1:17 PM  I have reviewed the triage vital signs and the nursing notes.   HISTORY  Chief Complaint Sore Throat   HPI Jessica Paul is a 58 y.o. female presenting for evaluation of sore throat that started yesterday.  Patient reports also has some nasal congestion and occasional cough.  Reports a hoarse voice.  Denies known fevers.  Has continued to overall eat and drink well.  States sore throat is a mild irritation and more scratchy.  No over-the-counter medication taken just prior to arrival.  States that she gets this every so often.  Denies chest pain, shortness of breath, abdominal pain or fevers.  Denies other aggravating alleviating factors reports otherwise doing well.  Rafael Bihari, MD: PCP    Past Medical History:  Diagnosis Date  . Migraines     There are no active problems to display for this patient.   Past Surgical History:  Procedure Laterality Date  . ABDOMINAL HYSTERECTOMY       No current facility-administered medications for this encounter.   Current Outpatient Medications:  .  albuterol (PROVENTIL HFA;VENTOLIN HFA) 108 (90 Base) MCG/ACT inhaler, Inhale 2 puffs into the lungs every 4 (four) hours as needed for wheezing., Disp: 1 Inhaler, Rfl: 0 .  amitriptyline (ELAVIL) 25 MG tablet, Take 25 mg by mouth at bedtime., Disp: , Rfl:   Allergies Patient has no known allergies.  Family History  Problem Relation Age of Onset  . Diabetes Mother   . Hypertension Mother   . Heart failure Father   . Breast cancer Neg Hx     Social History Social History   Tobacco Use  . Smoking status: Never Smoker  . Smokeless tobacco: Never Used  Substance Use Topics  . Alcohol use: No  . Drug use: No    Review of Systems Constitutional: No fever ENT: As above.  Cardiovascular: Denies chest pain. Respiratory: Denies shortness of  breath. Gastrointestinal: No abdominal pain.   Musculoskeletal: Negative for back pain. Skin: Negative for rash.  ____________________________________________   PHYSICAL EXAM:  VITAL SIGNS: ED Triage Vitals  Enc Vitals Group     BP 04/18/18 1132 116/83     Pulse Rate 04/18/18 1132 76     Resp 04/18/18 1132 18     Temp 04/18/18 1132 98.4 F (36.9 C)     Temp Source 04/18/18 1132 Oral     SpO2 04/18/18 1132 98 %     Weight 04/18/18 1133 150 lb (68 kg)     Height 04/18/18 1133 5\' 1"  (1.549 m)     Head Circumference --      Peak Flow --      Pain Score 04/18/18 1132 6     Pain Loc --      Pain Edu? --      Excl. in GC? --    Constitutional: Alert and oriented. Well appearing and in no acute distress. Eyes: Conjunctivae are normal.  Head: Atraumatic. No sinus tenderness to palpation. No swelling. No erythema.  Ears: no erythema, normal TMs bilaterally.   Nose:Nasal congestion   Mouth/Throat: Mucous membranes are moist. Mild pharyngeal erythema. No tonsillar swelling or exudate.  Hoarse voice. Neck: No stridor.  No cervical spine tenderness to palpation. Hematological/Lymphatic/Immunilogical: No cervical lymphadenopathy. Cardiovascular: Normal rate, regular rhythm. Grossly normal heart sounds.  Good peripheral circulation. Respiratory: Normal respiratory effort.  No retractions. No wheezes, rales or rhonchi. Good air movement.  Musculoskeletal:  Ambulatory with steady gait.  Neurologic:  Normal speech and language. No gait instability. Skin:  Skin appears warm, dry and intact. No rash noted. Psychiatric: Mood and affect are normal. Speech and behavior are normal. ___________________________________________   LABS (all labs ordered are listed, but only abnormal results are displayed)  Labs Reviewed - No data to display ____________________________________________   PROCEDURES Procedures     INITIAL IMPRESSION / ASSESSMENT AND PLAN / ED COURSE  Pertinent labs &  imaging results that were available during my care of the patient were reviewed by me and considered in my medical decision making (see chart for details).  Overall well-appearing patient.  No acute distress.  Suspect viral upper respiratory infection of viral pharyngitis and laryngitis.  Patient with mild pharyngeal erythema.  Patient refused strep swab for nurse as well as myself.  Counseled regarding this as unable to fully evaluate for strep otherwise.  Patient declined strep swab, and states that she does not think she has strep throat, and verbalized risk of not further evaluating.  Encourage rest, fluids, supportive care, over the counter lozenges.  Discussed follow-up and return parameters.  Discussed follow up with Primary care physician this week. Discussed follow up and return parameters including no resolution or any worsening concerns. Patient verbalized understanding and agreed to plan.   ____________________________________________   FINAL CLINICAL IMPRESSION(S) / ED DIAGNOSES  Final diagnoses:  Pharyngitis, unspecified etiology  Upper respiratory tract infection, unspecified type     ED Discharge Orders    None       Note: This dictation was prepared with Dragon dictation along with smaller phrase technology. Any transcriptional errors that result from this process are unintentional.         Renford Dills, NP 04/18/18 1742

## 2018-04-18 NOTE — Discharge Instructions (Addendum)
Over the counter medication as discussed. Cepacol lozenges. Rest. Drink plenty of fluids.   Follow up with your primary care physician this week as needed. Return to Urgent care for new or worsening concerns.

## 2018-04-18 NOTE — ED Triage Notes (Signed)
Patient c/o sore throat that started yesterday. Denies any other symptoms.  

## 2019-01-24 ENCOUNTER — Other Ambulatory Visit: Payer: Self-pay | Admitting: Internal Medicine

## 2019-01-24 DIAGNOSIS — Z1231 Encounter for screening mammogram for malignant neoplasm of breast: Secondary | ICD-10-CM

## 2019-04-24 ENCOUNTER — Ambulatory Visit: Payer: Managed Care, Other (non HMO)

## 2019-09-06 ENCOUNTER — Ambulatory Visit
Admission: RE | Admit: 2019-09-06 | Discharge: 2019-09-06 | Disposition: A | Discharge status: Home / Self Care | Payer: Managed Care, Other (non HMO) | Source: Ambulatory Visit | Attending: Internal Medicine | Admitting: Internal Medicine

## 2019-09-06 DIAGNOSIS — Z1231 Encounter for screening mammogram for malignant neoplasm of breast: Secondary | ICD-10-CM | POA: Insufficient documentation

## 2020-11-19 ENCOUNTER — Emergency Department: Payer: No Typology Code available for payment source

## 2020-11-19 ENCOUNTER — Other Ambulatory Visit: Payer: Self-pay

## 2020-11-19 ENCOUNTER — Emergency Department
Admission: EM | Admit: 2020-11-19 | Discharge: 2020-11-19 | Disposition: A | Discharge status: Home / Self Care | Payer: No Typology Code available for payment source | Attending: Emergency Medicine | Admitting: Emergency Medicine

## 2020-11-19 ENCOUNTER — Encounter: Payer: Self-pay | Admitting: Emergency Medicine

## 2020-11-19 DIAGNOSIS — W01198A Fall on same level from slipping, tripping and stumbling with subsequent striking against other object, initial encounter: Secondary | ICD-10-CM | POA: Diagnosis not present

## 2020-11-19 DIAGNOSIS — Y990 Civilian activity done for income or pay: Secondary | ICD-10-CM | POA: Insufficient documentation

## 2020-11-19 DIAGNOSIS — S300XXA Contusion of lower back and pelvis, initial encounter: Secondary | ICD-10-CM | POA: Insufficient documentation

## 2020-11-19 DIAGNOSIS — G44319 Acute post-traumatic headache, not intractable: Secondary | ICD-10-CM | POA: Insufficient documentation

## 2020-11-19 DIAGNOSIS — W19XXXA Unspecified fall, initial encounter: Secondary | ICD-10-CM

## 2020-11-19 DIAGNOSIS — S3993XA Unspecified injury of pelvis, initial encounter: Secondary | ICD-10-CM | POA: Diagnosis present

## 2020-11-19 DIAGNOSIS — S0990XA Unspecified injury of head, initial encounter: Secondary | ICD-10-CM | POA: Diagnosis not present

## 2020-11-19 MED ORDER — BUTALBITAL-APAP-CAFFEINE 50-325-40 MG PO TABS: 1.0000 | ORAL_TABLET | Freq: Four times a day (QID) | ORAL | 0 refills | Status: AC | PRN

## 2020-11-19 NOTE — ED Provider Notes (Signed)
[facility] Emergency Department Provider Note  ____________________________________________  Time seen: Approximately 4:33 PM  I have reviewed the triage vital signs and the nursing notes.   HISTORY  Chief Complaint Head Injury and Tailbone Pain    HPI Jessica Paul is a 60 y.o. female who presents the emergency department complaining of ongoing headache as well as tailbone pain since a fall 4 days ago.  Patient states that she was at work, was on a stepstool trying to reach some samples that were above her head.  She states that she missed a step, fell backwards.  During the fall she landed on her buttocks but also hit her head on a metal pole.  Patient did not seek care immediately as this again happened 4 days ago but she has had ongoing headache.  She does have a history of migraines and has been using her migraine medication without significant improvement.  She states that 1 night she did take one of her doses of meds, which seemed to improve her symptoms and she got a good nights worth of sleep.  However that headache has returned and persisted.  She is still tender over the skull where she hit her head and given the ongoing headache not resolving with medication she presents for evaluation of same.  She states that initially she had significant tailbone pain but states that this is improved and she does not believe that there is any injury as she is able to ambulate and sit without difficulty.  No radicular symptoms.  No bowel or bladder dysfunction, saddle anesthesia or paresthesias.  Patient has not taken any medication today for her migraines.       Past Medical History:  Diagnosis Date   Migraines     There are no problems to display for this patient.   Past Surgical History:  Procedure Laterality Date   ABDOMINAL HYSTERECTOMY      Prior to Admission medications   Medication Sig Start Date End Date Taking? Authorizing Provider   butalbital-acetaminophen-caffeine (FIORICET) 50-325-40 MG tablet Take 1 tablet by mouth every 6 (six) hours as needed for headache. 11/19/20 11/19/21 Yes Arloa Prak, Delorise Royals, PA-C  albuterol (PROVENTIL HFA;VENTOLIN HFA) 108 (90 Base) MCG/ACT inhaler Inhale 2 puffs into the lungs every 4 (four) hours as needed for wheezing. 03/17/17   Renford Dills, NP  amitriptyline (ELAVIL) 25 MG tablet Take 25 mg by mouth at bedtime.    [provider]    Allergies Patient has no known allergies.  Family History  Problem Relation Age of Onset   Diabetes Mother    Hypertension Mother    Heart failure Father    Breast cancer Neg Hx     Social History Social History   Tobacco Use   Smoking status: Never   Smokeless tobacco: Never  Substance Use Topics   Alcohol use: No   Drug use: No     Review of Systems  Constitutional: No fever/chills Eyes: No visual changes. No discharge ENT: No upper respiratory complaints. Cardiovascular: no chest pain. Respiratory: no cough. No SOB. Gastrointestinal: No abdominal pain.  No nausea, no vomiting.  No diarrhea.  No constipation. Musculoskeletal: Positive for pain over the coccyx Skin: Negative for rash, abrasions, lacerations, ecchymosis. Neurological: Positive for ongoing headache after hitting her head 4 days ago.  Denies focal weakness or numbness.  10 System ROS otherwise negative.  ____________________________________________   PHYSICAL EXAM:  VITAL SIGNS: ED Triage Vitals  Enc Vitals Group  BP 11/19/20 1557 130/84     Pulse Rate 11/19/20 1557 89     Resp 11/19/20 1557 20     Temp 11/19/20 1557 99 F (37.2 C)     Temp Source 11/19/20 1557 Oral     SpO2 11/19/20 1557 99 %     Weight 11/19/20 1558 160 lb (72.6 kg)     Height 11/19/20 1558 5\' 1"  (1.549 m)     Head Circumference --      Peak Flow --      Pain Score 11/19/20 1557 2     Pain Loc --      Pain Edu? --      Excl. in GC? --      Constitutional: Alert and  oriented. Well appearing and in no acute distress. Eyes: Conjunctivae are normal. PERRL. EOMI. Head: No gross signs of trauma with abrasions, lacerations, ecchymosis or hematoma.  No battle signs, raccoon eyes, serosanguineous fluid drainage from ears or nares.  Patient is tender to palpation of the right occipital skull which is where she hit her head.  There is no palpable deformity or crepitus.  No other tenderness to palpation of the osseous structures of the skull or face. ENT:      Ears:       Nose: No congestion/rhinnorhea.      Mouth/Throat: Mucous membranes are moist.  Neck: No stridor.  No cervical spine tenderness to palpation.  Cardiovascular: Normal rate, regular rhythm. Normal S1 and S2.  Good peripheral circulation. Respiratory: Normal respiratory effort without tachypnea or retractions. Lungs CTAB. Good air entry to the bases with no decreased or absent breath sounds. Gastrointestinal: Bowel sounds 4 quadrants. Soft and nontender to palpation. No guarding or rigidity. No palpable masses. No distention. No CVA tenderness. Musculoskeletal: Full range of motion to all extremities. No gross deformities appreciated. Neurologic:  Normal speech and language. No gross focal neurologic deficits are appreciated.  Cranial nerves II through XII grossly intact.  Negative for Romberg's and pronator drift Skin:  Skin is warm, dry and intact. No rash noted. Psychiatric: Mood and affect are normal. Speech and behavior are normal. Patient exhibits appropriate insight and judgement.   ____________________________________________   LABS (all labs ordered are listed, but only abnormal results are displayed)  Labs Reviewed - No data to display ____________________________________________  EKG   ____________________________________________  RADIOLOGY I personally viewed and evaluated these images as part of my medical decision making, as well as reviewing the written report by the  radiologist.  ED Provider Interpretation: No acute traumatic findings on CT scan or x-ray  DG Sacrum/Coccyx  Result Date: 11/19/2020 CLINICAL DATA:  Post fall with tailbone pain. Fall off a step stool striking head on a pole. EXAM: SACRUM AND COCCYX - 2+ VIEW COMPARISON:  None. FINDINGS: Cortical margins of the sacrum and coccyx are intact. There is no evidence of fracture or other focal bone lesions. The sacral ala are maintained. The sacroiliac joints are congruent. There is lower lumbar facet hypertrophy. IMPRESSION: No fracture of the sacrum and coccyx. Electronically Signed   By: 11/21/2020 M.D.   On: 11/19/2020 16:58   CT HEAD WO CONTRAST (11/21/2020)  Result Date: 11/19/2020 CLINICAL DATA:  Head trauma EXAM: CT HEAD WITHOUT CONTRAST TECHNIQUE: Contiguous axial images were obtained from the base of the skull through the vertex without intravenous contrast. COMPARISON:  None. FINDINGS: Brain: No evidence of acute infarction, hemorrhage, cerebral edema, mass, mass effect, or midline shift. Ventricles and sulci  are normal for age. No extra-axial fluid collection. Vascular: No hyperdense vessel or unexpected calcification. Skull: Normal. Negative for fracture or focal lesion. Sinuses/Orbits: No acute finding. Other: The mastoid air cells are well aerated. IMPRESSION: No acute intracranial process. Electronically Signed   By: Wiliam Ke M.D.   On: 11/19/2020 17:01    ____________________________________________    PROCEDURES  Procedure(s) performed:    Procedures    Medications - No data to display   ____________________________________________   INITIAL IMPRESSION / ASSESSMENT AND PLAN / ED COURSE  Pertinent labs & imaging results that were available during my care of the patient were reviewed by me and considered in my medical decision making (see chart for details).  Review of the [place] CSRS was performed in accordance of the [facility] prior to dispensing any controlled drugs.            Patient's diagnosis is consistent with fall, minor head injury with posttraumatic headache, coccygeal contusion.  Patient presented to the emergency department complaining of headache and pain on her tailbone.  Patient had sustained a fall from a stepstool hitting her head and landing on her buttocks.  Overall exam is reassuring but given the ongoing headache and pain patient was evaluated with imaging.  There is no evidence of traumatic findings to include skull fracture or intracranial hemorrhage on CT.  No fracture of the coccyx.  Patient will be treated symptomatically in regards to her headache.  She can continue her prescribed migraine medications and I will add Fioricet for additional headache relief.  I offered a migraine cocktail but the patient declined.  Patient exam is reassuring at this time and she is stable for discharge.  Return precautions discussed with the patient.  Otherwise follow-up primary care. Patient is given ED precautions to return to the ED for any worsening or new symptoms.     ____________________________________________  FINAL CLINICAL IMPRESSION(S) / ED DIAGNOSES  Final diagnoses:  Fall, initial encounter  Minor head injury, initial encounter  Acute post-traumatic headache, not intractable  Coccyx contusion, initial encounter      NEW MEDICATIONS STARTED DURING THIS VISIT:  ED Discharge Orders          Ordered    butalbital-acetaminophen-caffeine (FIORICET) 50-325-40 MG tablet  Every 6 hours PRN        11/19/20 1852                This chart was dictated using voice recognition software/Dragon. Despite best efforts to proofread, errors can occur which can change the meaning. Any change was purely unintentional.    Racheal Patches, PA-C 11/19/20 1900    Delton Prairie, MD 11/20/20 0003

## 2020-11-19 NOTE — ED Notes (Signed)
See triage note  States she fell from step stool  Hit head on pole no LOC  Also having pain to tailbone

## 2020-11-19 NOTE — ED Triage Notes (Signed)
Pt was at work and works at [employer]. She fell of a step stool and hit her head on a steel pole. No LOC. Does not take a blood thinner. She also hit her tail bone. She is able to walk without difficulty.

## 2020-11-22 IMAGING — MG DIGITAL SCREENING BILAT W/ TOMO W/ CAD
8 series · 8 of 24 positions shown · non-contrast
Comparison: Previous exam(s).

CLINICAL DATA: Screening.

EXAM:
DIGITAL SCREENING BILATERAL MAMMOGRAM WITH TOMO AND CAD

[L MLO synth-2D]
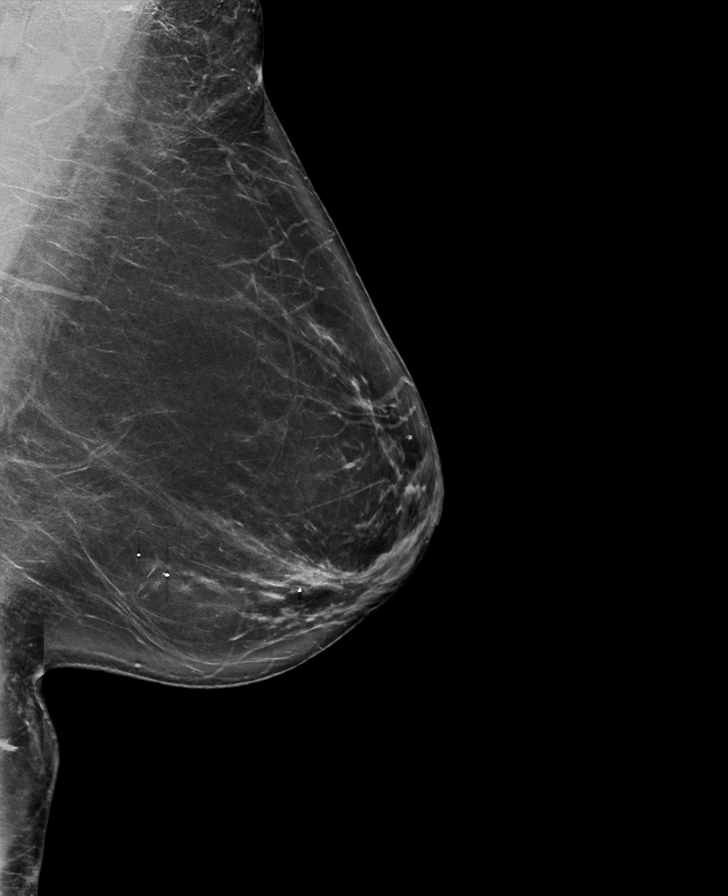

[R CC synth-2D]
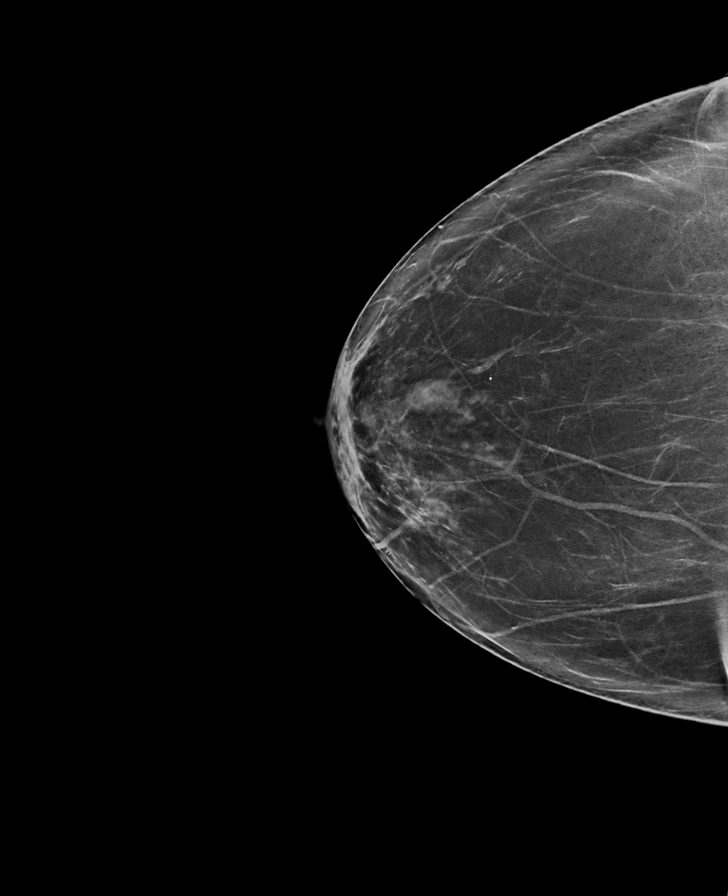

[R MLO synth-2D]
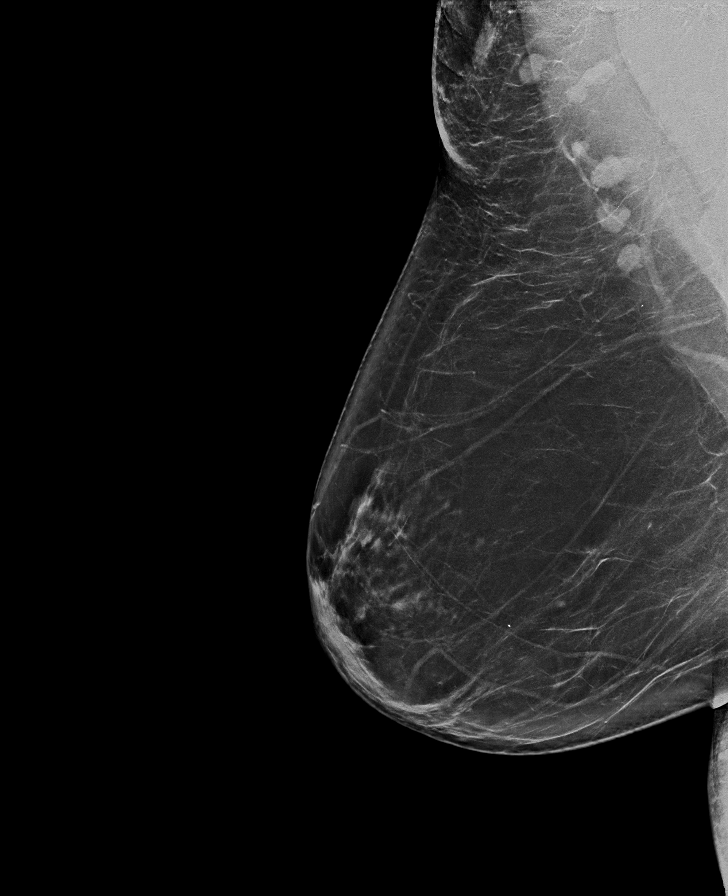

[L CC synth-2D]
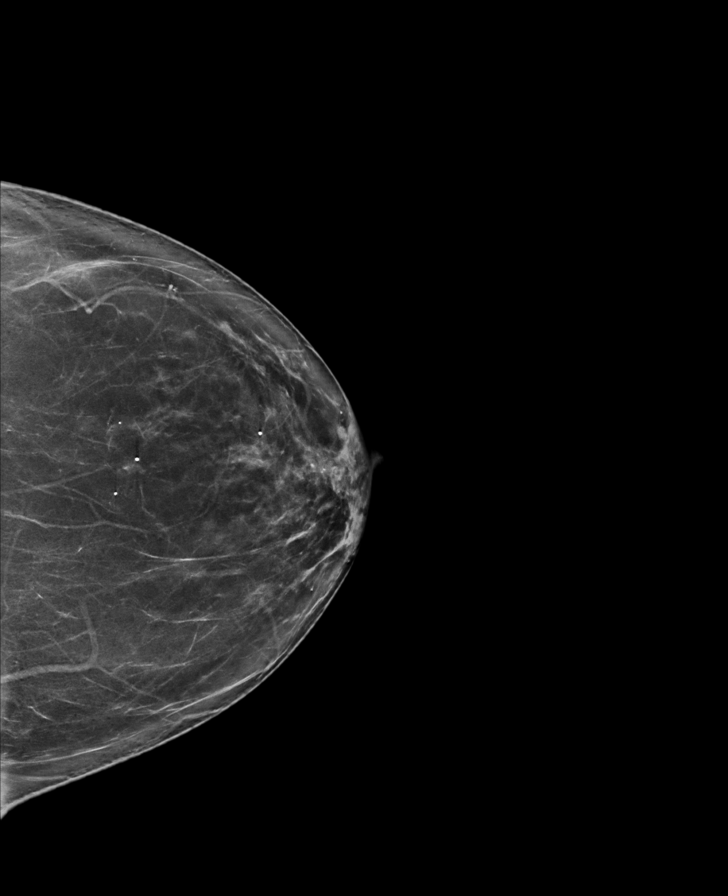

[L MLO tomo · tomo slice 43/86.0]
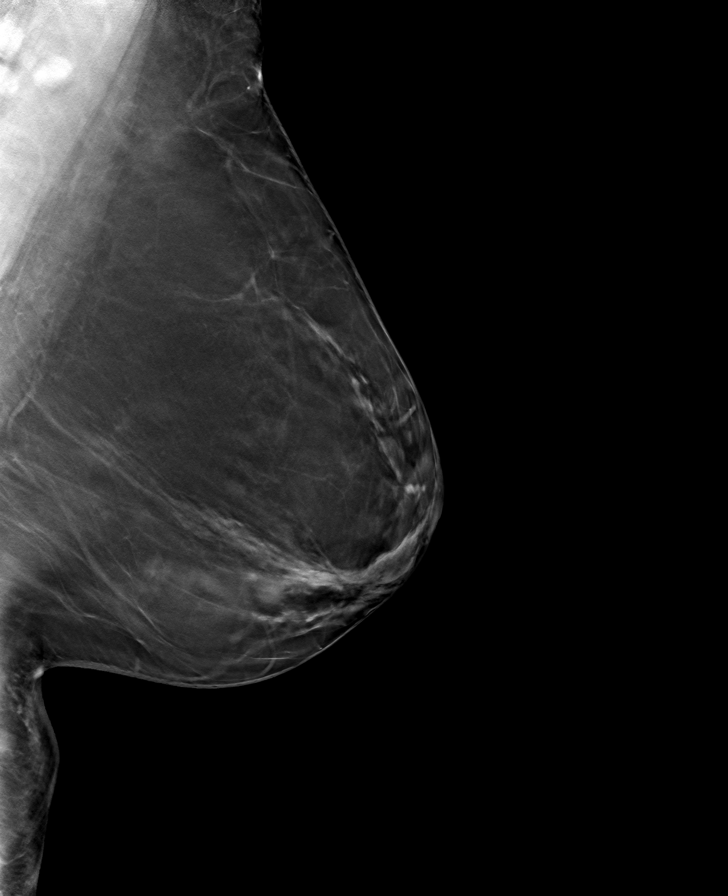

[R CC tomo · tomo slice 40/79.0]
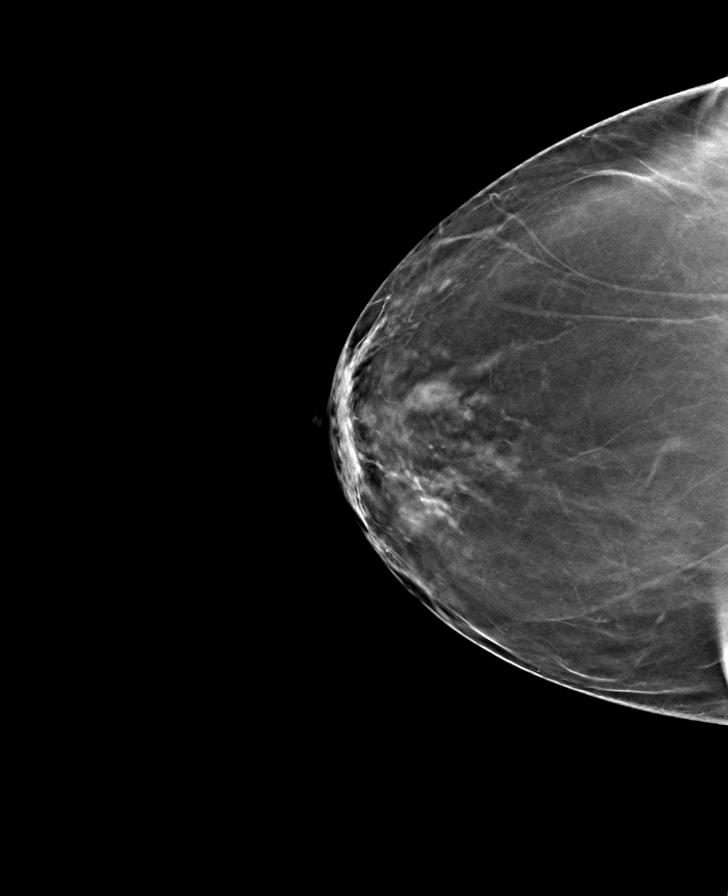

[R MLO tomo · tomo slice 45/90.0]
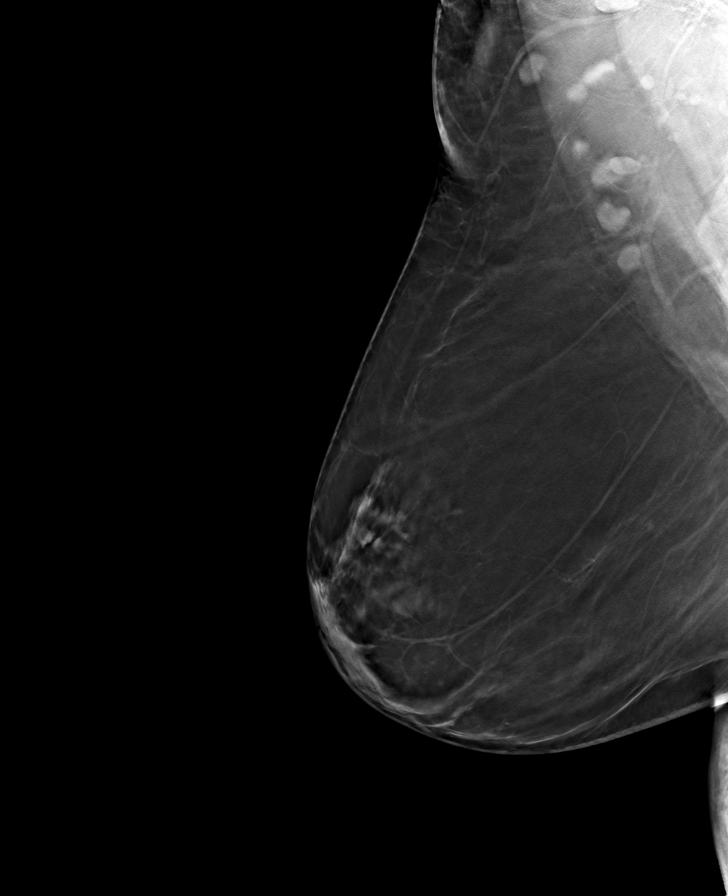

[L CC tomo · tomo slice 41/80.0]
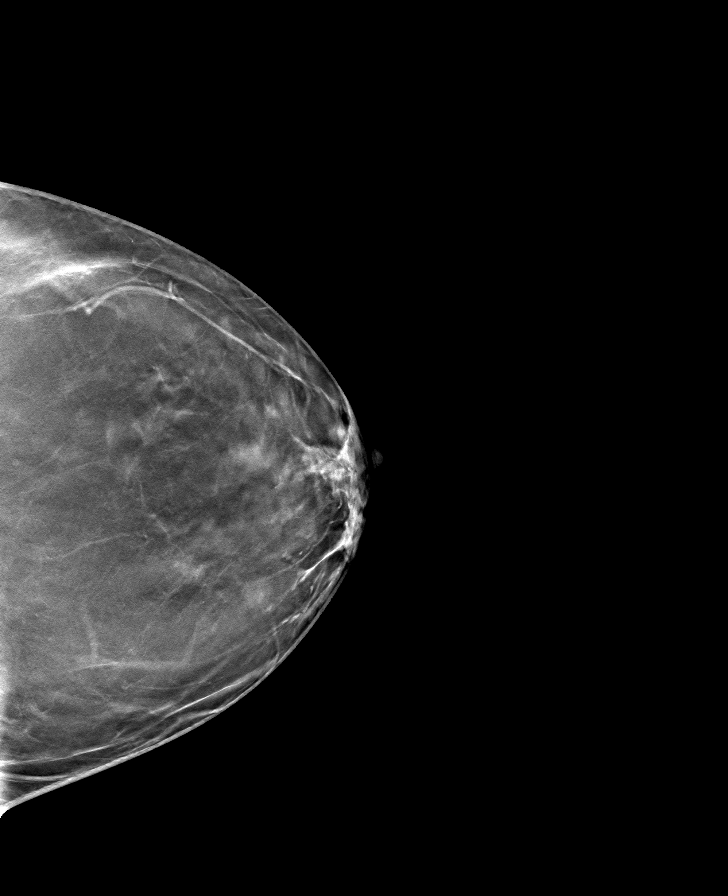

[8 of 24 positions shown; findings below may reference images not displayed]

ACR Breast Density Category b: There are scattered areas of
fibroglandular density.
FINDINGS: There are no findings suspicious for malignancy. Images were
processed with CAD.
IMPRESSION: No mammographic evidence of malignancy. A result letter of this
screening mammogram will be mailed directly to the patient.

RECOMMENDATION:
Screening mammogram in one year. (Code:[id number])

BI-RADS CATEGORY  1: Negative.

## 2021-08-16 ENCOUNTER — Other Ambulatory Visit: Payer: Self-pay | Admitting: Family Medicine

## 2021-08-16 DIAGNOSIS — Z1231 Encounter for screening mammogram for malignant neoplasm of breast: Secondary | ICD-10-CM

## 2021-11-04 ENCOUNTER — Ambulatory Visit
Admission: RE | Admit: 2021-11-04 | Discharge: 2021-11-04 | Disposition: A | Discharge status: Home / Self Care | Payer: Managed Care, Other (non HMO) | Source: Ambulatory Visit | Attending: Family Medicine | Admitting: Family Medicine

## 2021-11-04 DIAGNOSIS — Z1231 Encounter for screening mammogram for malignant neoplasm of breast: Secondary | ICD-10-CM | POA: Insufficient documentation

## 2021-11-05 ENCOUNTER — Other Ambulatory Visit: Payer: Self-pay | Admitting: Family Medicine

## 2021-11-09 ENCOUNTER — Other Ambulatory Visit: Payer: Self-pay | Admitting: Family Medicine

## 2021-11-09 DIAGNOSIS — R928 Other abnormal and inconclusive findings on diagnostic imaging of breast: Secondary | ICD-10-CM

## 2021-11-09 DIAGNOSIS — N6489 Other specified disorders of breast: Secondary | ICD-10-CM

## 2021-12-01 ENCOUNTER — Ambulatory Visit
Admission: RE | Admit: 2021-12-01 | Discharge: 2021-12-01 | Disposition: A | Discharge status: Home / Self Care | Payer: Managed Care, Other (non HMO) | Source: Ambulatory Visit | Attending: Family Medicine | Admitting: Family Medicine

## 2021-12-01 DIAGNOSIS — N6489 Other specified disorders of breast: Secondary | ICD-10-CM | POA: Insufficient documentation

## 2021-12-01 DIAGNOSIS — R928 Other abnormal and inconclusive findings on diagnostic imaging of breast: Secondary | ICD-10-CM | POA: Insufficient documentation

## 2021-12-07 ENCOUNTER — Other Ambulatory Visit: Payer: Self-pay | Admitting: Family Medicine

## 2021-12-07 DIAGNOSIS — N63 Unspecified lump in unspecified breast: Secondary | ICD-10-CM

## 2021-12-07 DIAGNOSIS — R928 Other abnormal and inconclusive findings on diagnostic imaging of breast: Secondary | ICD-10-CM

## 2021-12-23 ENCOUNTER — Ambulatory Visit
Admission: RE | Admit: 2021-12-23 | Discharge: 2021-12-23 | Disposition: A | Discharge status: Home / Self Care | Payer: Managed Care, Other (non HMO) | Source: Ambulatory Visit | Attending: Family Medicine | Admitting: Family Medicine

## 2021-12-23 DIAGNOSIS — N63 Unspecified lump in unspecified breast: Secondary | ICD-10-CM

## 2021-12-23 DIAGNOSIS — R928 Other abnormal and inconclusive findings on diagnostic imaging of breast: Secondary | ICD-10-CM | POA: Diagnosis present

## 2021-12-24 LAB — SURGICAL PATHOLOGY

## 2022-10-31 ENCOUNTER — Other Ambulatory Visit: Payer: Self-pay | Admitting: Family Medicine

## 2022-10-31 DIAGNOSIS — Z1231 Encounter for screening mammogram for malignant neoplasm of breast: Secondary | ICD-10-CM

## 2023-03-09 ENCOUNTER — Ambulatory Visit
Admission: RE | Admit: 2023-03-09 | Discharge: 2023-03-09 | Disposition: A | Discharge status: Home / Self Care | Payer: Managed Care, Other (non HMO) | Source: Ambulatory Visit | Attending: Family Medicine | Admitting: Family Medicine

## 2023-03-09 DIAGNOSIS — Z1231 Encounter for screening mammogram for malignant neoplasm of breast: Secondary | ICD-10-CM | POA: Insufficient documentation

## 2023-12-19 ENCOUNTER — Other Ambulatory Visit: Payer: Self-pay | Admitting: Family Medicine

## 2023-12-19 DIAGNOSIS — Z1231 Encounter for screening mammogram for malignant neoplasm of breast: Secondary | ICD-10-CM

## 2024-03-11 ENCOUNTER — Ambulatory Visit
Admission: RE | Admit: 2024-03-11 | Discharge: 2024-03-11 | Disposition: A | Source: Ambulatory Visit | Attending: Family Medicine | Admitting: Family Medicine

## 2024-03-11 DIAGNOSIS — Z1231 Encounter for screening mammogram for malignant neoplasm of breast: Secondary | ICD-10-CM
